# Patient Record
Sex: Female | Born: 1970 | Race: White | Hispanic: No | Marital: Married
Health system: Southern US, Community
[De-identification: ages and names within clinical notes are randomized; demographics above are authoritative.]

---

## 2011-07-03 ENCOUNTER — Ambulatory Visit: Payer: Self-pay | Admitting: Internal Medicine

## 2014-07-21 ENCOUNTER — Other Ambulatory Visit: Payer: Self-pay | Admitting: Internal Medicine

## 2014-07-21 DIAGNOSIS — R102 Pelvic and perineal pain: Secondary | ICD-10-CM

## 2014-07-23 ENCOUNTER — Other Ambulatory Visit: Payer: Self-pay | Admitting: Internal Medicine

## 2014-07-23 DIAGNOSIS — R102 Pelvic and perineal pain: Secondary | ICD-10-CM

## 2014-07-27 ENCOUNTER — Ambulatory Visit: Payer: Self-pay

## 2014-07-30 ENCOUNTER — Ambulatory Visit
Admission: RE | Admit: 2014-07-30 | Discharge: 2014-07-30 | Disposition: A | Discharge status: Home / Self Care | Payer: No Typology Code available for payment source | Source: Ambulatory Visit | Attending: Internal Medicine | Admitting: Internal Medicine

## 2014-07-30 DIAGNOSIS — D259 Leiomyoma of uterus, unspecified: Secondary | ICD-10-CM | POA: Diagnosis not present

## 2014-07-30 DIAGNOSIS — R102 Pelvic and perineal pain: Secondary | ICD-10-CM

## 2015-09-14 ENCOUNTER — Encounter (INDEPENDENT_AMBULATORY_CARE_PROVIDER_SITE_OTHER): Payer: Self-pay

## 2015-09-14 ENCOUNTER — Ambulatory Visit
Admission: RE | Admit: 2015-09-14 | Discharge: 2015-09-14 | Disposition: A | Discharge status: Home / Self Care | Payer: Self-pay | Source: Ambulatory Visit | Attending: Oncology | Admitting: Oncology

## 2015-09-14 ENCOUNTER — Encounter: Payer: Self-pay | Admitting: *Deleted

## 2015-09-14 ENCOUNTER — Ambulatory Visit: Payer: No Typology Code available for payment source | Attending: Internal Medicine | Admitting: *Deleted

## 2015-09-14 VITALS — BP 128/79 | HR 74 | Temp 98.1°F | Ht <= 58 in | Wt 233.4 lb

## 2015-09-14 DIAGNOSIS — Z0000 Encounter for general adult medical examination without abnormal findings: Secondary | ICD-10-CM

## 2015-09-14 NOTE — Patient Instructions (Signed)
Human Papillomavirus Human papillomavirus (HPV) is the most common sexually transmitted infection (STI) and is highly contagious. HPV infections cause genital warts and cancers to the outlet of the womb (cervix), birth canal (vagina), opening of the birth canal (vulva), and anus. There are over 100 types of HPV. Unless wartlike lesions are present in the throat or there are genital warts that you can see or feel, HPV usually does not cause symptoms. It is possible to be infected for long periods and pass it on to others without knowing it. CAUSES  HPV is spread from person to person through sexual contact. This includes oral, vaginal, or anal sex. RISK FACTORS  Having unprotected sex. HPV can be spread by oral, vaginal, or anal sex.  Having several sex partners.  Having a sex partner who has other sex partners.  Having or having had another sexually transmitted infection. SIGNS AND SYMPTOMS  Most people carrying HPV do not have any symptoms. If symptoms are present, symptoms may include:  Wartlike lesions in the throat (from having oral sex).  Warts in the infected skin or mucous membranes.  Genital warts that may itch, burn, or bleed.  Genital warts that may be painful or bleed during sexual intercourse. DIAGNOSIS  If wartlike lesions are present in the throat or genital warts are present, your health care provider can usually diagnose HPV by physical examination.   Genital warts are easily seen with the naked eye.  Currently, there is no FDA-approved test to detect HPV in males.  In females, a Pap test can show cells that are infected with HPV.  In females, a scope can be used to view the cervix (colposcopy). A colposcopy can be performed if the pelvic exam or Pap test is abnormal. A sample of tissue may be removed (biopsy) during the colposcopy. TREATMENT  There is no treatment for the virus itself. However, there are treatments for the health problems and symptoms HPV can cause.  Your health care provider will follow you closely after you are treated. This is because the HPV can come back and may need treatment again. Treatment of HPV may include:   Medicines, which may be injected or applied in a cream, lotion, or gel form.  Use of a probe to apply extreme cold (cryotherapy).  Application of an intense beam of light (laser treatment).  Use of a probe to apply extreme heat (electrocautery).  Surgery. HOME CARE INSTRUCTIONS   Take medicines only as directed by your health care provider.  Use over-the-counter creams for itching or irritation as directed by your health care provider.  Keep all follow-up visits as directed by your health care provider. This is important.  Do not touch or scratch the warts.  Do not treat genital warts with medicines used for treating hand warts.  Do not have sex while you are being treated.  Do not douche or use tampons during treatment of HPV.  Tell your sex partner about your infection because he or she may also need treatment.  If you become pregnant, tell your health care provider that you have had HPV. Your health care provider will monitor you closely during pregnancy to be sure your baby is safe.  After treatment, use condoms during sex to prevent future infections.  Have only one sex partner.  Have a sex partner who does not have other sex partners. PREVENTION   Talk to your health care provider about getting the HPV vaccines. These vaccines prevent some HPV infections and cancers.  It is recommended that the vaccine be given to males and females between the ages of 46 and 30 years old. It will not work if you already have HPV, and it is not recommended for pregnant women.  A Pap test is done to screen for cervical cancer in women.  The first Pap test should be done at age 46 years.  Between ages 1 and 46 years, Pap tests are repeated every 2 years.  Beginning at age 46, you are advised to have a Pap test every  3 years as long as your past 3 Pap tests have been normal.  Some women have medical problems that increase the chance of getting cervical cancer. Talk to your health care provider about these problems. It is especially important to talk to your health care provider if a new problem develops soon after your last Pap test. In these cases, your health care provider may recommend more frequent screening and Pap tests.  The above recommendations are the same for women who have or have not gotten the vaccine for HPV.  If you had a hysterectomy for a problem that was not a cancer or a condition that could lead to cancer, then you no longer need Pap tests. However, even if you no longer need a Pap test, a regular exam is a good idea to make sure no other problems are starting.   If you are between the ages of 46 and 64 years and you have had normal Pap tests going back 10 years, you no longer need Pap tests. However, even if you no longer need a Pap test, a regular exam is a good idea to make sure no other problems are starting.  If you have had past treatment for cervical cancer or a condition that could lead to cancer, you need Pap tests and screening for cancer for at least 20 years after your treatment.  If Pap tests have been discontinued, risk factors (such as a new sexual partner)need to be reassessed to determine if screening should be resumed.  Some women may need screenings more often if they are at high risk for cervical cancer. SEEK MEDICAL CARE IF:   The treated skin becomes red, swollen, or painful.  You have a fever.  You feel generally ill.  You feel lumps or pimple-like projections in and around your genital area.  You develop bleeding of the vagina or the treatment area.  You have painful sexual intercourse. MAKE SURE YOU:   Understand these instructions.  Will watch your condition.  Will get help if you are not doing well or get worse.   This information is not  intended to replace advice given to you by your health care provider. Make sure you discuss any questions you have with your health care provider.   Document Released: 03/31/2003 Document Revised: 01/29/2014 Document Reviewed: 04/15/2013 Elsevier Interactive Patient Education 2016 [place] patient hand-out, Women Staying Healthy, Active and Well from [place], with education on breast health, pap smears, heart and colon health.

## 2015-09-14 NOTE — Progress Notes (Signed)
Subjective:     Patient ID: Andrea Werner, female   DOB: 1970/05/29, 45 y.o.   MRN: [id number]  HPI   Review of Systems     Objective:   Physical Exam  Pulmonary/Chest: Right breast exhibits no inverted nipple, no mass, no nipple discharge, no skin change and no tenderness. Left breast exhibits no inverted nipple, no mass, no nipple discharge, no skin change and no tenderness.  Abdominal: There is no splenomegaly or hepatomegaly.  Genitourinary: There is no rash, tenderness, lesion or injury on the right labia. There is no rash, tenderness, lesion or injury on the left labia. Cervix exhibits friability. Cervix exhibits no motion tenderness. Right adnexum displays no mass, no tenderness and no fullness. Left adnexum displays no mass, no tenderness and no fullness.         Assessment:     45 year old White female presents to [facility] for clinical breast exam, pap and mammogram.  Clinical breast exam unremarkable.  Taught self breast awareness.  Specimen collected for pap smear.  Patients menstrual cycle ended on Monday.  Cervix very friable on exam and bled easily.  Patient has been screened for eligibility.  She does not have any insurance, Medicare or Medicaid.  She also meets financial eligibility.  Hand-out given on the Affordable Care Act.    Plan:     Screening mammogram ordered.  Specimen sent to the lab.  Will follow-up per protocol.

## 2015-09-16 LAB — PAP LB AND HPV HIGH-RISK
HPV, HIGH-RISK: NEGATIVE
PAP SMEAR COMMENT: 0

## 2015-09-20 ENCOUNTER — Encounter: Payer: Self-pay | Admitting: *Deleted

## 2015-09-20 NOTE — Progress Notes (Signed)
Mailed patient a letter with her normal mammogram and pap smear results.  Next mammogram due in 1 year and pap due in 5 years.  [facility] to [facility].

## 2017-12-27 ENCOUNTER — Other Ambulatory Visit: Payer: Self-pay | Admitting: Internal Medicine

## 2017-12-27 DIAGNOSIS — Z1231 Encounter for screening mammogram for malignant neoplasm of breast: Secondary | ICD-10-CM

## 2018-01-31 ENCOUNTER — Ambulatory Visit
Admission: RE | Admit: 2018-01-31 | Discharge: 2018-01-31 | Disposition: A | Discharge status: Home / Self Care | Payer: BC Managed Care – PPO | Source: Ambulatory Visit | Attending: Internal Medicine | Admitting: Internal Medicine

## 2018-01-31 DIAGNOSIS — Z1231 Encounter for screening mammogram for malignant neoplasm of breast: Secondary | ICD-10-CM | POA: Diagnosis not present

## 2018-08-19 ENCOUNTER — Inpatient Hospital Stay: Admission: RE | Admit: 2018-08-19 | Payer: BC Managed Care – PPO | Source: Ambulatory Visit

## 2018-08-22 ENCOUNTER — Ambulatory Visit: Admit: 2018-08-22 | Payer: BC Managed Care – PPO | Admitting: Obstetrics and Gynecology

## 2018-08-22 SURGERY — DILATATION AND CURETTAGE /HYSTEROSCOPY
Anesthesia: Choice

## 2019-01-02 ENCOUNTER — Other Ambulatory Visit: Payer: Self-pay | Admitting: Internal Medicine

## 2019-01-02 DIAGNOSIS — Z1231 Encounter for screening mammogram for malignant neoplasm of breast: Secondary | ICD-10-CM

## 2019-02-13 ENCOUNTER — Ambulatory Visit
Admission: RE | Admit: 2019-02-13 | Discharge: 2019-02-13 | Disposition: A | Discharge status: Home / Self Care | Payer: BC Managed Care – PPO | Source: Ambulatory Visit | Attending: Internal Medicine | Admitting: Internal Medicine

## 2019-02-13 DIAGNOSIS — Z1231 Encounter for screening mammogram for malignant neoplasm of breast: Secondary | ICD-10-CM | POA: Diagnosis not present

## 2020-02-29 ENCOUNTER — Other Ambulatory Visit: Payer: Self-pay | Admitting: Internal Medicine

## 2020-02-29 DIAGNOSIS — Z1231 Encounter for screening mammogram for malignant neoplasm of breast: Secondary | ICD-10-CM

## 2020-04-22 ENCOUNTER — Other Ambulatory Visit: Payer: Self-pay

## 2020-04-22 ENCOUNTER — Ambulatory Visit
Admission: RE | Admit: 2020-04-22 | Discharge: 2020-04-22 | Disposition: A | Discharge status: Home / Self Care | Payer: BC Managed Care – PPO | Source: Ambulatory Visit | Attending: Internal Medicine | Admitting: Internal Medicine

## 2020-04-22 DIAGNOSIS — Z1231 Encounter for screening mammogram for malignant neoplasm of breast: Secondary | ICD-10-CM | POA: Diagnosis present

## 2021-04-12 ENCOUNTER — Other Ambulatory Visit: Payer: Self-pay | Admitting: Internal Medicine

## 2021-04-12 DIAGNOSIS — Z1231 Encounter for screening mammogram for malignant neoplasm of breast: Secondary | ICD-10-CM

## 2021-05-19 ENCOUNTER — Ambulatory Visit
Admission: RE | Admit: 2021-05-19 | Discharge: 2021-05-19 | Disposition: A | Discharge status: Home / Self Care | Payer: BC Managed Care – PPO | Source: Ambulatory Visit | Attending: Internal Medicine | Admitting: Internal Medicine

## 2021-05-19 DIAGNOSIS — Z1231 Encounter for screening mammogram for malignant neoplasm of breast: Secondary | ICD-10-CM | POA: Insufficient documentation

## 2021-10-13 IMAGING — MG MM DIGITAL SCREENING BILAT W/ TOMO AND CAD
8 series · 8 of 24 positions shown · non-contrast
Comparison: Previous exam(s).

CLINICAL DATA: Screening.

EXAM:
DIGITAL SCREENING BILATERAL MAMMOGRAM WITH TOMOSYNTHESIS AND CAD
TECHNIQUE: Bilateral screening digital craniocaudal and mediolateral oblique
mammograms were obtained. Bilateral screening digital breast
tomosynthesis was performed. The images were evaluated with
computer-aided detection.

[L MLO synth-2D]
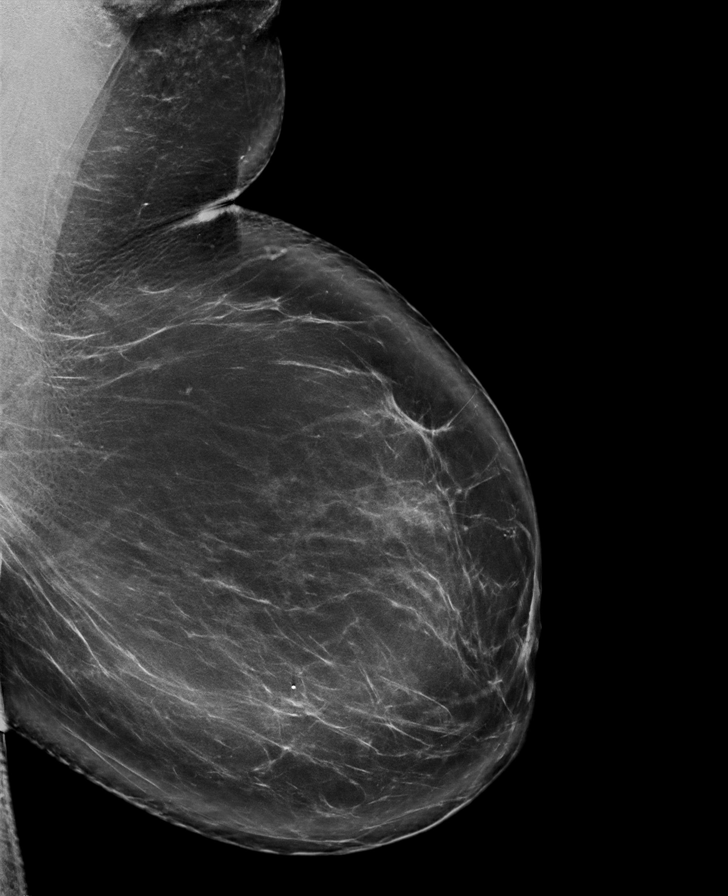

[L CC synth-2D]
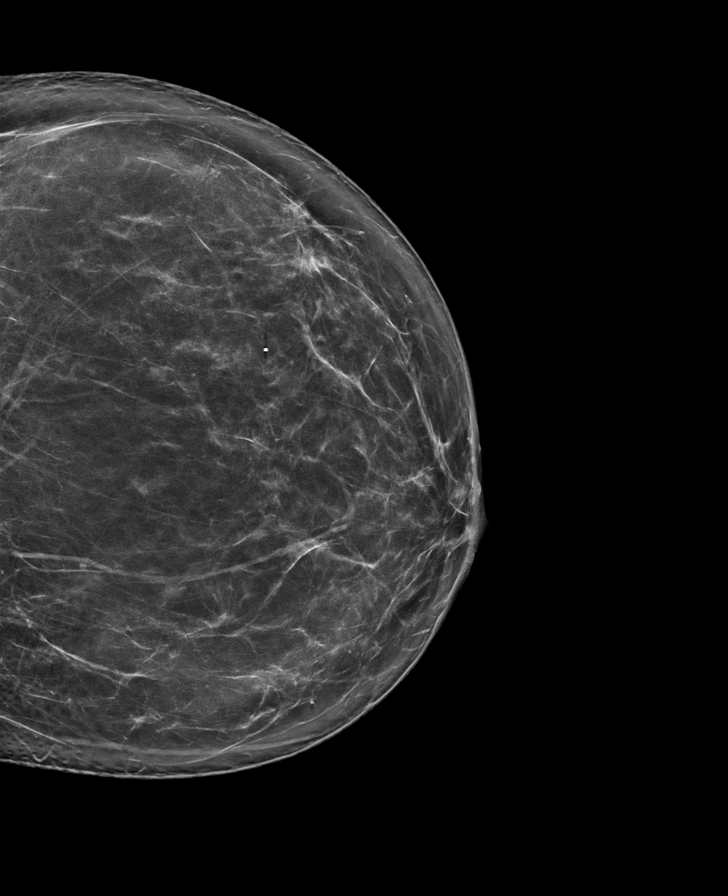

[R MLO synth-2D]
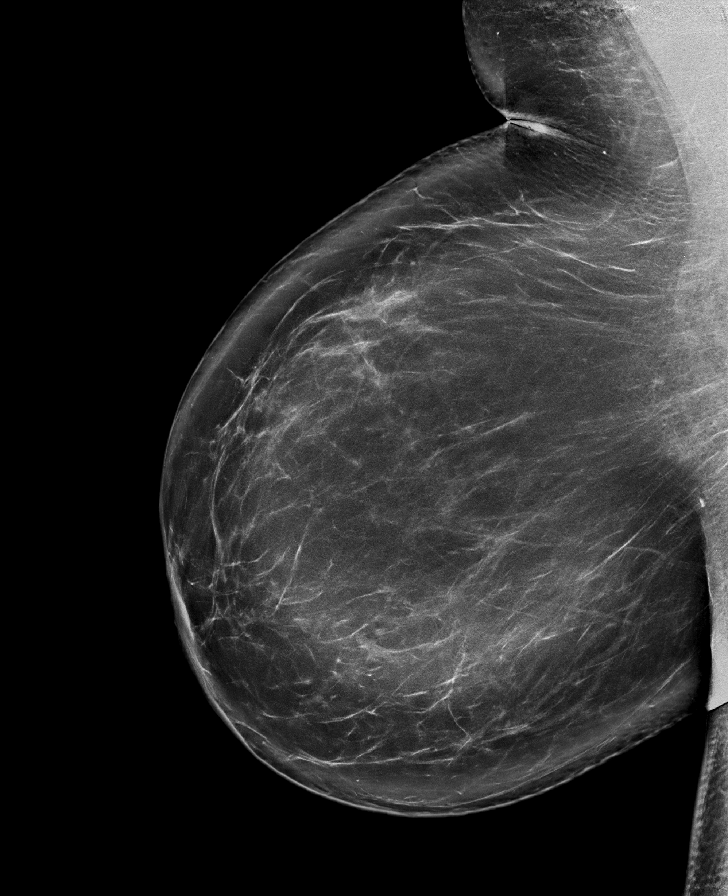

[R CC synth-2D]
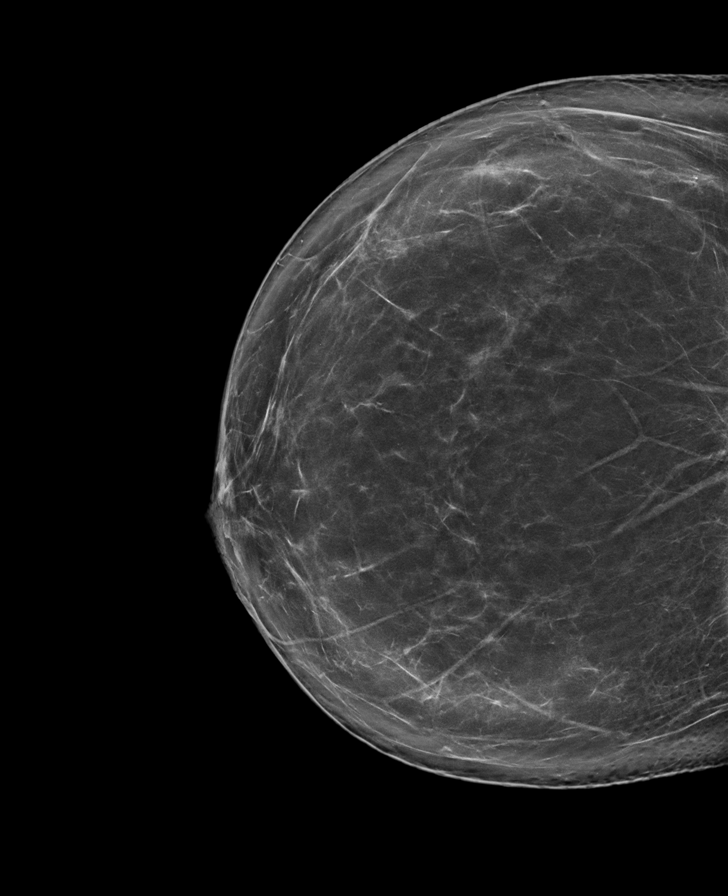

[L MLO tomo · tomo slice 55/108.0]
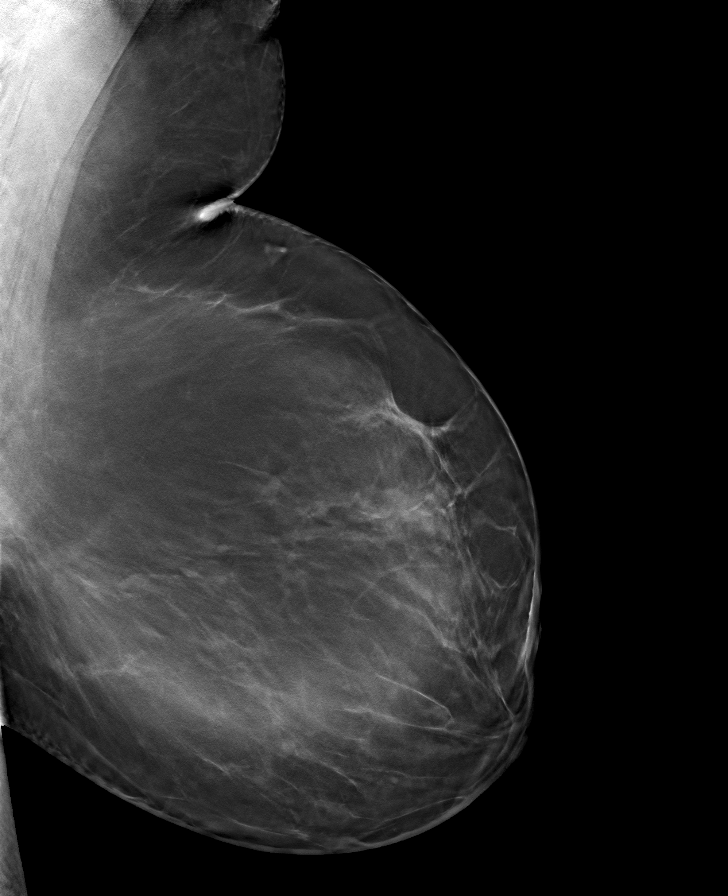

[L CC tomo · tomo slice 43/84.0]
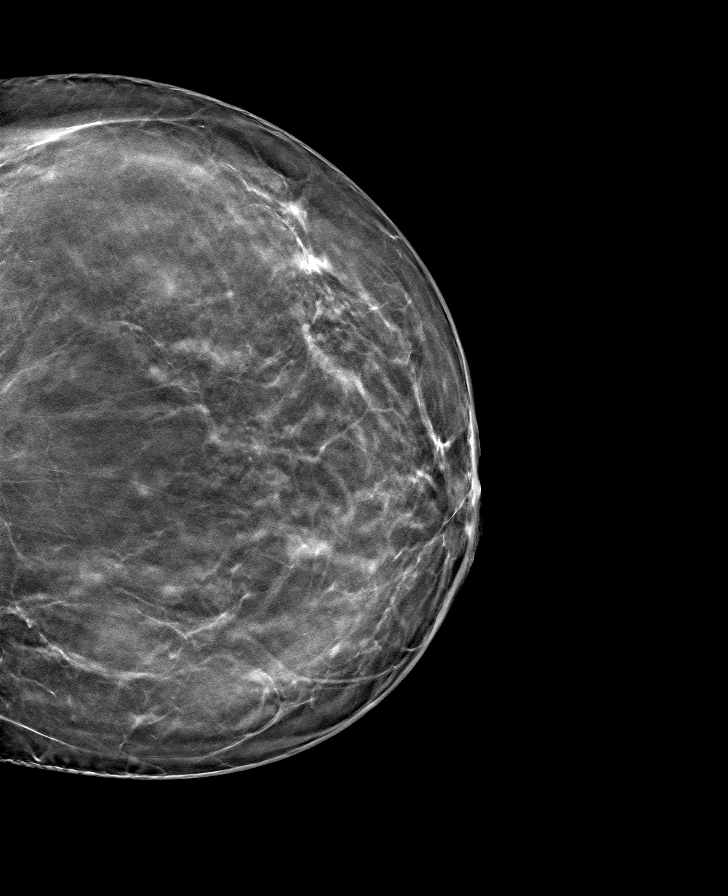

[R CC tomo · tomo slice 49/98.0]
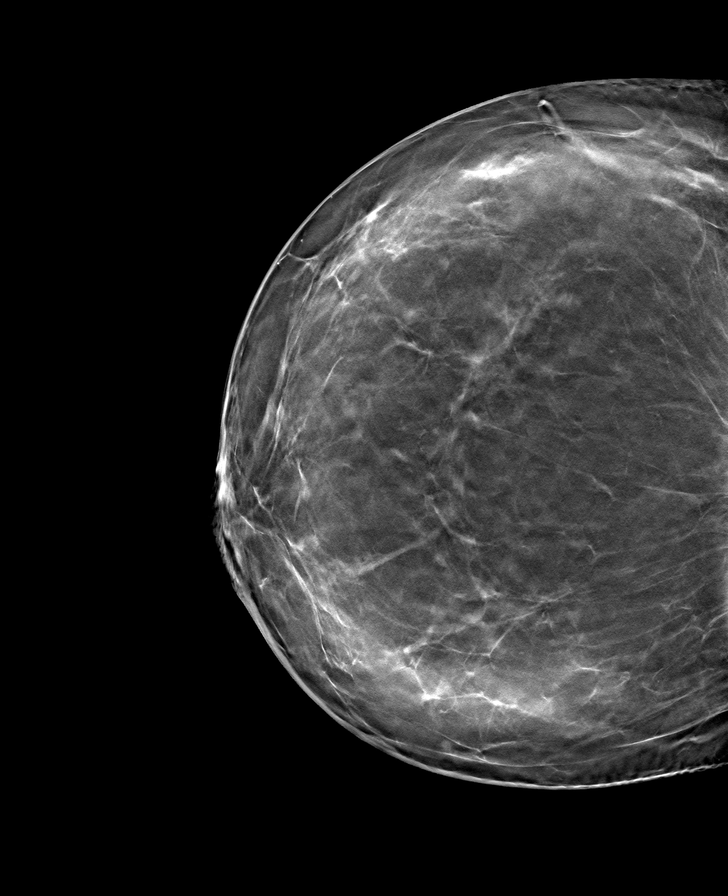

[R MLO tomo · tomo slice 53/106.0]
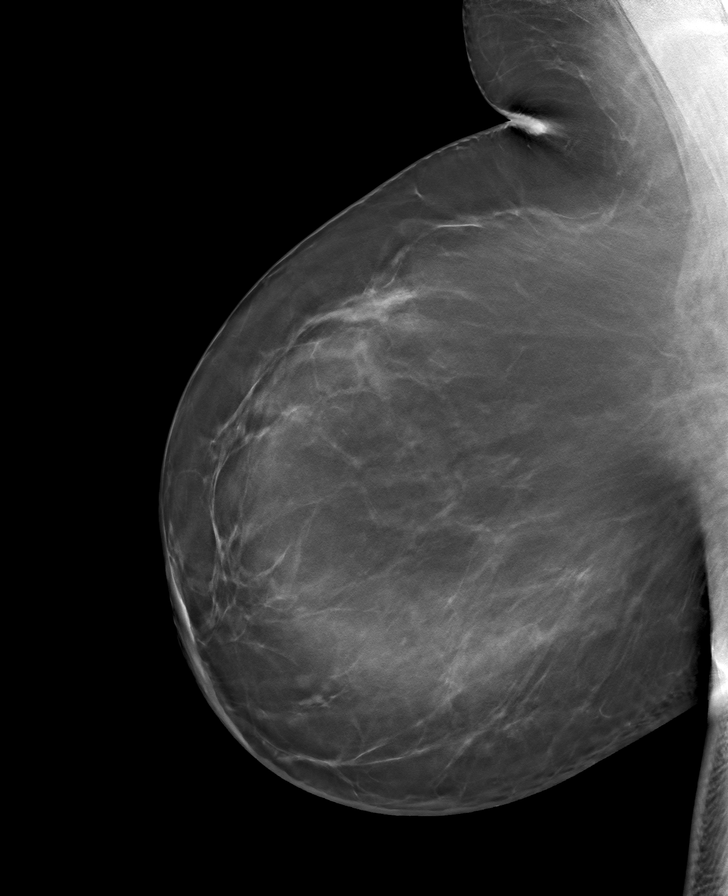

[8 of 24 positions shown; findings below may reference images not displayed]

ACR Breast Density Category b: There are scattered areas of
fibroglandular density.
FINDINGS: There are no findings suspicious for malignancy. The images were
evaluated with computer-aided detection.
IMPRESSION: No mammographic evidence of malignancy. A result letter of this
screening mammogram will be mailed directly to the patient.

RECOMMENDATION:
Screening mammogram in one year. (Code:[id number])

BI-RADS CATEGORY  1: Negative.

## 2022-05-07 ENCOUNTER — Other Ambulatory Visit: Payer: Self-pay | Admitting: Internal Medicine

## 2022-05-07 DIAGNOSIS — Z1231 Encounter for screening mammogram for malignant neoplasm of breast: Secondary | ICD-10-CM

## 2022-06-22 ENCOUNTER — Ambulatory Visit
Admission: RE | Admit: 2022-06-22 | Discharge: 2022-06-22 | Disposition: A | Discharge status: Home / Self Care | Payer: BC Managed Care – PPO | Source: Ambulatory Visit | Attending: Internal Medicine | Admitting: Internal Medicine

## 2022-06-22 DIAGNOSIS — Z1231 Encounter for screening mammogram for malignant neoplasm of breast: Secondary | ICD-10-CM | POA: Insufficient documentation

## 2023-07-05 ENCOUNTER — Other Ambulatory Visit: Payer: Self-pay | Admitting: Internal Medicine

## 2023-07-05 DIAGNOSIS — Z1231 Encounter for screening mammogram for malignant neoplasm of breast: Secondary | ICD-10-CM

## 2023-07-24 LAB — COLOGUARD: COLOGUARD: NEGATIVE

## 2023-09-20 ENCOUNTER — Ambulatory Visit
Admission: RE | Admit: 2023-09-20 | Discharge: 2023-09-20 | Disposition: A | Discharge status: Home / Self Care | Payer: Self-pay | Source: Ambulatory Visit | Attending: Internal Medicine | Admitting: Internal Medicine

## 2023-09-20 DIAGNOSIS — Z1231 Encounter for screening mammogram for malignant neoplasm of breast: Secondary | ICD-10-CM | POA: Diagnosis present
